# Patient Record
Sex: Female | Born: 2000 | Race: Black or African American | Hispanic: No | Marital: Single | Smoking: Never smoker
Health system: Southern US, Community
[De-identification: ages and names within clinical notes are randomized; demographics above are authoritative.]

## PROBLEM LIST (undated history)

## (undated) DIAGNOSIS — B009 Herpesviral infection, unspecified: Secondary | ICD-10-CM

---
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
## 2022-07-08 ENCOUNTER — Encounter (HOSPITAL_COMMUNITY): Payer: Self-pay | Admitting: *Deleted

## 2022-07-08 ENCOUNTER — Emergency Department (HOSPITAL_COMMUNITY)
Admission: EM | Admit: 2022-07-08 | Discharge: 2022-07-08 | Disposition: A | Discharge status: Home / Self Care | Payer: Medicaid Other | Attending: Emergency Medicine | Admitting: Emergency Medicine

## 2022-07-08 ENCOUNTER — Other Ambulatory Visit: Payer: Self-pay

## 2022-07-08 DIAGNOSIS — R300 Dysuria: Secondary | ICD-10-CM | POA: Diagnosis present

## 2022-07-08 LAB — PREGNANCY, URINE: Preg Test, Ur: NEGATIVE

## 2022-07-08 LAB — URINALYSIS, ROUTINE W REFLEX MICROSCOPIC
Bilirubin Urine: NEGATIVE
Glucose, UA: NEGATIVE mg/dL
Hgb urine dipstick: NEGATIVE
Ketones, ur: NEGATIVE mg/dL
Leukocytes,Ua: NEGATIVE
Nitrite: NEGATIVE
Protein, ur: NEGATIVE mg/dL
Specific Gravity, Urine: 1.018 (ref 1.005–1.030)
pH: 7 (ref 5.0–8.0)

## 2022-07-08 NOTE — ED Provider Notes (Signed)
[place] EMERGENCY DEPARTMENT AT [facility] Provider Note   CSN: [id number] Arrival date & time: 07/08/22  1013     History  Chief Complaint  Patient presents with   Dysuria    Nina Scott is a 22 y.o. female.  HPI 22 year old female presents today complaining of dysuria.  She states that she has been seen at her primary care office and diagnosed with bacterial vaginosis and chlamydia.  She was treated for both of these.  She continued to have urinary frequency and was treated for UTI.  She has completed those antibiotics.  She has continued to have some whitish discharge.  She has had ongoing pain with urination.  She has not had fever or chills.  She has some low back pain.     Home Medications Prior to Admission medications   Not on File      Allergies    Patient has no known allergies.    Review of Systems   Review of Systems  Physical Exam Updated Vital Signs BP (!) 143/100 (BP Location: Right Arm)   Pulse (!) 105   Temp 98.4 F (36.9 C) (Oral)   Resp 16   Ht 1.524 m (5')   Wt 83.5 kg   SpO2 100%   BMI 35.94 kg/m  Physical Exam Vitals and nursing note reviewed.  Constitutional:      General: She is not in acute distress.    Appearance: She is well-developed.  HENT:     Head: Normocephalic and atraumatic.     Right Ear: External ear normal.     Left Ear: External ear normal.     Nose: Nose normal.  Eyes:     Conjunctiva/sclera: Conjunctivae normal.     Pupils: Pupils are equal, round, and reactive to light.  Pulmonary:     Effort: Pulmonary effort is normal.  Genitourinary:    General: Normal vulva.     Comments: Mild white vaginal discharge No periurethral lesions noted No swelling or abscess noted Musculoskeletal:        General: Normal range of motion.     Cervical back: Normal range of motion and neck supple.  Skin:    General: Skin is warm and dry.  Neurological:     Mental Status: She is alert and oriented to person,  place, and time.     Motor: No abnormal muscle tone.     Coordination: Coordination normal.  Psychiatric:        Behavior: Behavior normal.        Thought Content: Thought content normal.     ED Results / Procedures / Treatments   Labs (all labs ordered are listed, but only abnormal results are displayed) Labs Reviewed  URINALYSIS, ROUTINE W REFLEX MICROSCOPIC  PREGNANCY, URINE    EKG None  Radiology No results found.  Procedures Procedures    Medications Ordered in ED Medications - No data to display  ED Course/ Medical Decision Making/ A&P                             Medical Decision Making Amount and/or Complexity of Data Reviewed Labs: ordered.   22 year old female presents today with dysuria.  She has recently been treated with multiple antibiotics.  Urinalysis here is normal. Differential diagnosis includes but is not limited to urethritis, STIs including herpetic lesions, pyelo-, Patient with no fever and normal urinalysis. We discussed that if there is no  evidence of UTI will not continue to treat. She will drink plenty of fluids and follow-up with her primary care doctor.        Final Clinical Impression(s) / ED Diagnoses Final diagnoses:  Dysuria    Rx / DC Orders ED Discharge Orders     None         Margarita Grizzle, MD 07/08/22 1324

## 2022-07-08 NOTE — ED Notes (Signed)
New urine sample sent to lab 

## 2022-07-08 NOTE — Discharge Instructions (Addendum)
Please drink plenty of fluids Make sure that your partner has also been treated for sexually transmitted infections Refrain from sexual intercourse for the next week Follow-up with your primary care doctor

## 2022-07-08 NOTE — ED Triage Notes (Signed)
Reports for a couple weeks of painful urinary symptoms. Has been checked and treated for 06/27/2022.

## 2023-01-22 ENCOUNTER — Ambulatory Visit: Payer: Medicaid Other | Admitting: Nurse Practitioner

## 2023-03-05 NOTE — Progress Notes (Unsigned)
   ANNUAL EXAM Patient name: Nina Scott MRN [id number]  Date of birth: 2000-05-02 Chief Complaint:   No chief complaint on file.  History of Present Illness:   Nina Scott is a 23 y.o. No obstetric history on file. {race:25618} female being seen today for a routine annual exam.  Current complaints: recurrent UTI with >2 infections in 6 moth period.  Dysuria urinary frequency urgency suprapubic pain Re-infection vs relapse: Symptoms recur within 2 weeks of treatment Related to timing of intercourse?  No LMP recorded.   The pregnancy intention screening data noted above was reviewed. Potential methods of contraception were discussed. The patient elected to proceed with No data recorded.   Last pap ***. Results were: {Pap findings:25134}. H/O abnormal pap: {yes/yes***/no:23866} Last mammogram: ***. Results were: {normal, abnormal, n/a:23837}. Family h/o breast cancer: {yes***/no:23838} Last colonoscopy: ***. Results were: {normal, abnormal, n/a:23837}. Family h/o colorectal cancer: {yes***/no:23838} STI testing:  Contraception:  Gardasil:      No data to display               No data to display           Review of Systems:   Pertinent items are noted in HPI Denies any headaches, blurred vision, fatigue, shortness of breath, chest pain, abdominal pain, abnormal vaginal discharge/itching/odor/irritation, problems with periods, bowel movements, urination, or intercourse unless otherwise stated above. Pertinent History Reviewed:  Reviewed past medical,surgical, social and family history.  Reviewed problem list, medications and allergies. Physical Assessment:  There were no vitals filed for this visit.There is no height or weight on file to calculate BMI.        Physical Examination:   General appearance - well appearing, and in no distress  Mental status - alert, oriented to person, place, and time  Psych:  She has a normal mood and affect  Skin - warm and dry,  normal color, no suspicious lesions noted  Chest - effort normal, all lung fields clear to auscultation bilaterally  Heart - normal rate and regular rhythm  Neck:  midline trachea, no thyromegaly or nodules  Breasts - breasts appear normal, no suspicious masses, no skin or nipple changes or  axillary nodes  Abdomen - soft, nontender, nondistended, no masses or organomegaly  Pelvic - VULVA: normal appearing vulva with no masses, tenderness or lesions  VAGINA: normal appearing vagina with normal color and discharge, no lesions  CERVIX: normal appearing cervix without discharge or lesions, no CMT  Thin prep pap is {Desc; done/not:10129} *** HR HPV cotesting  UTERUS: uterus is felt to be normal size, shape, consistency and nontender   ADNEXA: No adnexal masses or tenderness noted.  Rectal - normal rectal, good sphincter tone, no masses felt. Hemoccult: ***  Extremities:  No swelling or varicosities noted  Chaperone present for exam  No results found for this or any previous visit (from the past 24 hours).  Assessment & Plan:  Liberal fluid intake, avoid spermicides, postcoital voiding, 8 ounce cranberry juice once or twice daily, or cranberry concentrate tablets 500 mL to 1000 mg daily  Mammogram: {Mammo f/u:25212::"@ 21yo"}, or sooner if problems Colonoscopy: {TCS f/u:25213::"@ 21yo"}, or sooner if problems  No orders of the defined types were placed in this encounter.   Meds: No orders of the defined types were placed in this encounter.   Follow-up: No follow-ups on file.  Nina Scott, [place] 03/05/2023 4:34 PM

## 2023-03-06 ENCOUNTER — Encounter: Payer: Self-pay | Admitting: Physician Assistant

## 2023-03-06 ENCOUNTER — Ambulatory Visit: Payer: Medicaid Other | Admitting: Physician Assistant

## 2023-03-06 ENCOUNTER — Other Ambulatory Visit (HOSPITAL_COMMUNITY)
Admission: RE | Admit: 2023-03-06 | Discharge: 2023-03-06 | Disposition: A | Discharge status: Home / Self Care | Payer: Medicaid Other | Source: Ambulatory Visit | Attending: Physician Assistant | Admitting: Physician Assistant

## 2023-03-06 VITALS — BP 122/76 | HR 93 | Ht 60.0 in | Wt 198.5 lb

## 2023-03-06 DIAGNOSIS — Z113 Encounter for screening for infections with a predominantly sexual mode of transmission: Secondary | ICD-10-CM | POA: Insufficient documentation

## 2023-03-06 DIAGNOSIS — Z124 Encounter for screening for malignant neoplasm of cervix: Secondary | ICD-10-CM | POA: Diagnosis not present

## 2023-03-06 DIAGNOSIS — R300 Dysuria: Secondary | ICD-10-CM | POA: Diagnosis not present

## 2023-03-06 DIAGNOSIS — Z01419 Encounter for gynecological examination (general) (routine) without abnormal findings: Secondary | ICD-10-CM | POA: Diagnosis not present

## 2023-03-06 DIAGNOSIS — N390 Urinary tract infection, site not specified: Secondary | ICD-10-CM | POA: Insufficient documentation

## 2023-03-06 LAB — POCT URINALYSIS DIPSTICK
Bilirubin, UA: NEGATIVE
Glucose, UA: NEGATIVE
Ketones, UA: 15
Leukocytes, UA: NEGATIVE
Nitrite, UA: NEGATIVE
Protein, UA: POSITIVE — AB
Spec Grav, UA: 1.01 (ref 1.010–1.025)
Urobilinogen, UA: 0.2 U/dL
pH, UA: 6.5 (ref 5.0–8.0)

## 2023-03-06 MED ORDER — PHENAZOPYRIDINE HCL 200 MG PO TABS: 200.0000 mg | ORAL_TABLET | Freq: Three times a day (TID) | ORAL | 0 refills | Status: AC

## 2023-03-06 NOTE — Patient Instructions (Addendum)
Please pick up your medicine from your chosen pharmacy. Take one tablet 3 times a day with meals. Do this for two days.   Website for contraception options: [employer]edsider.org  Please avoid these common bladder irritants: Alcoholic Beverages Apples and Apple Juice Cantaloupe Peaches Pineapple Plums Strawberries Cranberries and Cranberry Juice Citrus Fruit Tomatoes and tomato based products Carbonated beverages Caffeine beverages Coffee Tea Milk products: Vinegar Spicy foods Chocolates Artificial Sweeteners

## 2023-03-07 LAB — RPR: RPR Ser Ql: NONREACTIVE

## 2023-03-07 LAB — HIV ANTIBODY (ROUTINE TESTING W REFLEX): HIV Screen 4th Generation wRfx: NONREACTIVE

## 2023-03-08 ENCOUNTER — Encounter: Payer: Self-pay | Admitting: Physician Assistant

## 2023-03-08 LAB — URINE CULTURE

## 2023-03-09 ENCOUNTER — Encounter: Payer: Self-pay | Admitting: Physician Assistant

## 2023-03-09 ENCOUNTER — Other Ambulatory Visit: Payer: Self-pay | Admitting: General Practice

## 2023-03-09 LAB — CERVICOVAGINAL ANCILLARY ONLY
Bacterial Vaginitis (gardnerella): NEGATIVE
Candida Glabrata: NEGATIVE
Candida Vaginitis: NEGATIVE
Chlamydia: NEGATIVE
Comment: NEGATIVE
Comment: NEGATIVE
Comment: NEGATIVE
Comment: NEGATIVE
Comment: NEGATIVE
Comment: NORMAL
Neisseria Gonorrhea: NEGATIVE
Trichomonas: NEGATIVE

## 2023-03-09 LAB — CYTOLOGY - PAP: Diagnosis: NEGATIVE

## 2023-03-09 MED ORDER — PHENAZOPYRIDINE HCL 200 MG PO TABS: 200.0000 mg | ORAL_TABLET | Freq: Three times a day (TID) | ORAL | 0 refills | Status: DC | PRN

## 2023-03-09 NOTE — Progress Notes (Signed)
 Drug change request received for pharmacy. Rx for Pyridium Tabs sent to pharmacy per request.

## 2023-03-12 ENCOUNTER — Ambulatory Visit (HOSPITAL_BASED_OUTPATIENT_CLINIC_OR_DEPARTMENT_OTHER): Payer: Medicaid Other

## 2023-03-15 ENCOUNTER — Ambulatory Visit (HOSPITAL_COMMUNITY)
Admission: EM | Admit: 2023-03-15 | Discharge: 2023-03-15 | Disposition: A | Discharge status: Home / Self Care | Attending: Emergency Medicine | Admitting: Emergency Medicine

## 2023-03-15 ENCOUNTER — Encounter (HOSPITAL_COMMUNITY): Payer: Self-pay

## 2023-03-15 DIAGNOSIS — J029 Acute pharyngitis, unspecified: Secondary | ICD-10-CM | POA: Diagnosis not present

## 2023-03-15 DIAGNOSIS — J988 Other specified respiratory disorders: Secondary | ICD-10-CM

## 2023-03-15 DIAGNOSIS — B9789 Other viral agents as the cause of diseases classified elsewhere: Secondary | ICD-10-CM

## 2023-03-15 LAB — POC COVID19/FLU A&B COMBO
Covid Antigen, POC: NEGATIVE
Influenza A Antigen, POC: NEGATIVE
Influenza B Antigen, POC: NEGATIVE

## 2023-03-15 LAB — POCT RAPID STREP A (OFFICE): Rapid Strep A Screen: NEGATIVE

## 2023-03-15 MED ORDER — LIDOCAINE VISCOUS HCL 2 % MT SOLN: 15.0000 mL | OROMUCOSAL | 0 refills | Status: AC | PRN

## 2023-03-15 NOTE — ED Triage Notes (Signed)
 Pt states that she has a sore throat, cough, chest congestion and headache. X2 days

## 2023-03-15 NOTE — ED Provider Notes (Signed)
 [facility]    CSN: [id number] Arrival date & time: 03/15/23  1254      History   Chief Complaint Chief Complaint  Patient presents with   Sore Throat    Sore throat, cough, chest congestion and headache    HPI Nina Scott is a 23 y.o. female.   Patient presents with sore throat, cough, chest congestion, headache x 2 days. Denies known fever, shortness of breath, chest pain, abdominal pain, nausea, vomiting, and diarrhea.    Sore Throat    History reviewed. No pertinent past medical history.  There are no active problems to display for this patient.   History reviewed. No pertinent surgical history.  OB History     Gravida  0   Para  0   Term  0   Preterm  0   AB  0   Living  0      SAB  0   IAB  0   Ectopic  0   Multiple  0   Live Births  0            Home Medications    Prior to Admission medications   Medication Sig Start Date End Date Taking? Authorizing Provider  lidocaine (XYLOCAINE) 2 % solution Use as directed 15 mLs in the mouth or throat as needed for mouth pain. 03/15/23  Yes Letta Kocher, NP    Family History Family History  Problem Relation Age of Onset   Hypertension Maternal Grandmother    Hypertension Mother     Social History Social History   Tobacco Use   Smoking status: Never   Smokeless tobacco: Never  Vaping Use   Vaping status: Never Used  Substance Use Topics   Alcohol use: Yes    Comment: occ   Drug use: Not Currently    Types: Marijuana     Allergies   Patient has no known allergies.   Review of Systems Review of Systems  Per HPI  Physical Exam Triage Vital Signs ED Triage Vitals  Encounter Vitals Group     BP 03/15/23 1334 119/73     Systolic BP Percentile --      Diastolic BP Percentile --      Pulse Rate 03/15/23 1334 (!) 113     Resp 03/15/23 1334 19     Temp 03/15/23 1334 98 F (36.7 C)     Temp Source 03/15/23 1334 Oral     SpO2 03/15/23 1334 95 %      Weight 03/15/23 1332 198 lb (89.8 kg)     Height 03/15/23 1332 5' (1.524 m)     Head Circumference --      Peak Flow --      Pain Score 03/15/23 1332 8     Pain Loc --      Pain Education --      Exclude from Growth Chart --    No data found.  Updated Vital Signs BP 119/73 (BP Location: Left Arm)   Pulse (!) 113   Temp 98 F (36.7 C) (Oral)   Resp 19   Ht 5' (1.524 m)   Wt 198 lb (89.8 kg)   LMP 02/11/2023   SpO2 95%   BMI 38.67 kg/m   Visual Acuity Right Eye Distance:   Left Eye Distance:   Bilateral Distance:    Right Eye Near:   Left Eye Near:    Bilateral Near:     Physical Exam Vitals  and nursing note reviewed.  Constitutional:      General: She is awake. She is not in acute distress.    Appearance: Normal appearance. She is well-developed and well-groomed. She is not ill-appearing.  HENT:     Right Ear: Tympanic membrane, ear canal and external ear normal.     Left Ear: Tympanic membrane, ear canal and external ear normal.     Nose: Congestion and rhinorrhea present.     Mouth/Throat:     Mouth: Mucous membranes are moist.     Pharynx: Posterior oropharyngeal erythema present. No oropharyngeal exudate.  Cardiovascular:     Rate and Rhythm: Normal rate and regular rhythm.  Pulmonary:     Effort: Pulmonary effort is normal.     Breath sounds: Normal breath sounds.  Musculoskeletal:        General: Normal range of motion.  Skin:    General: Skin is warm and dry.  Neurological:     Mental Status: She is alert.  Psychiatric:        Behavior: Behavior is cooperative.      UC Treatments / Results  Labs (all labs ordered are listed, but only abnormal results are displayed) Labs Reviewed  POC COVID19/FLU A&B COMBO  POCT RAPID STREP A (OFFICE)    EKG   Radiology No results found.  Procedures Procedures (including critical care time)  Medications Ordered in UC Medications - No data to display  Initial Impression / Assessment and Plan / UC  Course  I have reviewed the triage vital signs and the nursing notes.  Pertinent labs & imaging results that were available during my care of the patient were reviewed by me and considered in my medical decision making (see chart for details).     Upon assessment congestion and rhinorrhea are present, mild erythema noted to pharynx. Lungs clear bilaterally on auscultation.  COVID, flu and strep testing negative. Prescribed lidocaine as needed for sore throat. Discussed OTC medication as needed for symptoms. Discussed return precautions.  Final Clinical Impressions(s) / UC Diagnoses   Final diagnoses:  Viral respiratory illness  Sore throat     Discharge Instructions      Flu, COVID, and strep testing were negative today. I believe your symptoms are from a viral illness. I have prescribed lidocaine that you can gargle spit to help with sore throat, do not swallow.   Otherwise you can alternate between Tylenol and Ibuprofen as needed for pain and fever. I recommend Mucinex for cough and congestion as needed. Stay hydrated and get plenty of rest. Return here if symptoms persist or worsen.      ED Prescriptions     Medication Sig Dispense Auth. Provider   lidocaine (XYLOCAINE) 2 % solution Use as directed 15 mLs in the mouth or throat as needed for mouth pain. 100 mL Wynonia Lawman A, NP      PDMP not reviewed this encounter.   Wynonia Lawman A, NP 03/15/23 [phone]

## 2023-03-15 NOTE — Discharge Instructions (Addendum)
 Flu, COVID, and strep testing were negative today. I believe your symptoms are from a viral illness. I have prescribed lidocaine that you can gargle spit to help with sore throat, do not swallow.   Otherwise you can alternate between Tylenol and Ibuprofen as needed for pain and fever. I recommend Mucinex for cough and congestion as needed. Stay hydrated and get plenty of rest. Return here if symptoms persist or worsen.

## 2023-04-09 ENCOUNTER — Ambulatory Visit

## 2023-04-10 ENCOUNTER — Ambulatory Visit (HOSPITAL_COMMUNITY)

## 2023-04-20 ENCOUNTER — Encounter: Admitting: Podiatry

## 2023-04-20 NOTE — Progress Notes (Signed)
Patient did not show for scheduled appointment today.

## 2023-06-04 ENCOUNTER — Ambulatory Visit

## 2023-07-31 ENCOUNTER — Ambulatory Visit (HOSPITAL_COMMUNITY)
Admission: RE | Admit: 2023-07-31 | Discharge: 2023-07-31 | Disposition: A | Discharge status: Home / Self Care | Source: Ambulatory Visit | Attending: Emergency Medicine | Admitting: Emergency Medicine

## 2023-07-31 ENCOUNTER — Encounter (HOSPITAL_COMMUNITY): Payer: Self-pay

## 2023-07-31 VITALS — BP 112/73 | HR 106 | Temp 98.5°F | Resp 18

## 2023-07-31 DIAGNOSIS — R600 Localized edema: Secondary | ICD-10-CM | POA: Diagnosis not present

## 2023-07-31 DIAGNOSIS — Z3202 Encounter for pregnancy test, result negative: Secondary | ICD-10-CM

## 2023-07-31 DIAGNOSIS — M7989 Other specified soft tissue disorders: Secondary | ICD-10-CM | POA: Diagnosis present

## 2023-07-31 HISTORY — DX: Herpesviral infection, unspecified: B00.9

## 2023-07-31 LAB — POCT URINE PREGNANCY: Preg Test, Ur: NEGATIVE

## 2023-07-31 LAB — BASIC METABOLIC PANEL WITH GFR
Anion gap: 10 (ref 5–15)
BUN: 9 mg/dL (ref 6–20)
CO2: 23 mmol/L (ref 22–32)
Calcium: 8.9 mg/dL (ref 8.9–10.3)
Chloride: 104 mmol/L (ref 98–111)
Creatinine, Ser: 0.77 mg/dL (ref 0.44–1.00)
GFR, Estimated: >60 mL/min (ref >60)
Glucose, Bld: 93 mg/dL (ref 70–99)
Potassium: 3.7 mmol/L (ref 3.5–5.1)
Sodium: 137 mmol/L (ref 135–145)

## 2023-07-31 NOTE — Discharge Instructions (Signed)
 For now continue to take your Flagyl and Valtrex as prescribed. We have drawn a labs today to check your kidney function to ensure that your kidneys are not affected by the Valtrex.  If results are concerning an adjustment in treatment is recommended someone will call to let you know. Otherwise follow-up with your primary care provider or return here as needed. If you develop significantly increased leg swelling, shortness of breath, chest pain, dizziness, or weakness please seek immediate medical treatment in the emergency department.

## 2023-07-31 NOTE — ED Provider Notes (Addendum)
 [facility]    CSN: [id number] Arrival date & time: 07/31/23  1842      History   Chief Complaint Chief Complaint  Patient presents with   Foot Pain    Swollen feet - Entered by patient   Possible Pregnancy    HPI Nina Scott is a 23 y.o. female.   Patient presents with bilateral foot swelling that began today.  Patient denies pain to her feet but endorses a pressure sensation to her feet.  Patient states that she does work long hours on her feet and is concerned because the bottom of her that is broken with her feet hanging lower than the rest of her body and wonders if this could be causing her feet to swell as well.  Patient states that she does started taking Flagyl and Valtrex 2 days ago for bacterial vaginosis and HSV and wonders if this could be related.  Patient does report that her last menstrual cycle was on 617 and is requesting a pregnancy test as well.  Denies chest pain, shortness of breath, dizziness, and weakness.  The history is provided by the patient and medical records.  Foot Pain  Possible Pregnancy    Past Medical History:  Diagnosis Date   HSV (herpes simplex virus) infection     There are no active problems to display for this patient.   History reviewed. No pertinent surgical history.  OB History     Gravida  0   Para  0   Term  0   Preterm  0   AB  0   Living  0      SAB  0   IAB  0   Ectopic  0   Multiple  0   Live Births  0            Home Medications    Prior to Admission medications   Medication Sig Start Date End Date Taking? Authorizing Provider  metroNIDAZOLE (FLAGYL) 500 MG tablet Take 500 mg by mouth 2 (two) times daily. 07/27/23  Yes [provider]  valACYclovir (VALTREX) 1000 MG tablet Take 1,000 mg by mouth 2 (two) times daily. 07/27/23  Yes [provider]  lidocaine  (XYLOCAINE ) 2 % solution Use as directed 15 mLs in the mouth or throat as needed for mouth pain.  03/15/23   Johnie Rumaldo LABOR, NP    Family History Family History  Problem Relation Age of Onset   Hypertension Maternal Grandmother    Hypertension Mother     Social History Social History   Tobacco Use   Smoking status: Never   Smokeless tobacco: Never  Vaping Use   Vaping status: Never Used  Substance Use Topics   Alcohol use: Yes    Comment: occ   Drug use: Not Currently    Types: Marijuana     Allergies   Patient has no known allergies.   Review of Systems Review of Systems  Per HPI  Physical Exam Triage Vital Signs ED Triage Vitals  Encounter Vitals Group     BP 07/31/23 1855 112/73     Girls Systolic BP Percentile --      Girls Diastolic BP Percentile --      Boys Systolic BP Percentile --      Boys Diastolic BP Percentile --      Pulse Rate 07/31/23 1855 (!) 106     Resp 07/31/23 1855 18     Temp 07/31/23 1855 98.5 F (  36.9 C)     Temp Source 07/31/23 1855 Oral     SpO2 07/31/23 1855 97 %     Weight --      Height --      Head Circumference --      Peak Flow --      Pain Score 07/31/23 1852 2     Pain Loc --      Pain Education --      Exclude from Growth Chart --    No data found.  Updated Vital Signs BP 112/73 (BP Location: Left Arm)   Pulse (!) 106   Temp 98.5 F (36.9 C) (Oral)   Resp 18   LMP 06/30/2023 (Exact Date)   SpO2 97%   Visual Acuity Right Eye Distance:   Left Eye Distance:   Bilateral Distance:    Right Eye Near:   Left Eye Near:    Bilateral Near:     Physical Exam Vitals and nursing note reviewed.  Constitutional:      General: She is not in acute distress.    Appearance: Normal appearance. She is not ill-appearing.  Cardiovascular:     Rate and Rhythm: Normal rate and regular rhythm.     Pulses: Normal pulses.          Dorsalis pedis pulses are 2+ on the right side and 2+ on the left side.       Posterior tibial pulses are 2+ on the right side and 2+ on the left side.     Heart sounds: Normal heart  sounds.  Pulmonary:     Effort: Pulmonary effort is normal.     Breath sounds: Normal breath sounds.  Musculoskeletal:     Right lower leg: 1+ Edema present.     Left lower leg: 1+ Edema present.  Skin:    General: Skin is warm and dry.  Neurological:     Mental Status: She is alert.      UC Treatments / Results  Labs (all labs ordered are listed, but only abnormal results are displayed) Labs Reviewed  BASIC METABOLIC PANEL WITH GFR  POCT URINE PREGNANCY    EKG   Radiology No results found.  Procedures Procedures (including critical care time)  Medications Ordered in UC Medications - No data to display  Initial Impression / Assessment and Plan / UC Course  I have reviewed the triage vital signs and the nursing notes.  Pertinent labs & imaging results that were available during my care of the patient were reviewed by me and considered in my medical decision making (see chart for details).     Patient is overall well-appearing.  Vitals are stable.  Mild nonpitting edema present to bilateral feet.  Pulses present.  Urine pregnancy negative.  Ordered BMP to evaluate kidney function.  Recommended continuing taking Flagyl and Valtrex as prescribed for now.  Discussed follow-up, return, and strict ER precautions. Final Clinical Impressions(s) / UC Diagnoses   Final diagnoses:  Foot swelling     Discharge Instructions      For now continue to take your Flagyl and Valtrex as prescribed. We have drawn a labs today to check your kidney function to ensure that your kidneys are not affected by the Valtrex.  If results are concerning an adjustment in treatment is recommended someone will call to let you know. Otherwise follow-up with your primary care provider or return here as needed. If you develop significantly increased leg swelling, shortness of breath, chest pain,  dizziness, or weakness please seek immediate medical treatment in the emergency department.    ED  Prescriptions   None    PDMP not reviewed this encounter.   Johnie Rumaldo LABOR, NP 07/31/23 2016    Johnie Rumaldo A, NP 07/31/23 2016

## 2023-07-31 NOTE — ED Triage Notes (Signed)
 Pt states that her has bilateral ankle swelling that started today . She has a lot of pressure. She states she does stand all day and the foot of her bed is broken so her legs hang lower than her head all night.   She started flagyl and valtrex Wednesday not sure if its related, she held them today   When talking she realized her LMP was 06/30/2023 she asked for a pregnancy test.

## 2023-08-03 ENCOUNTER — Ambulatory Visit (HOSPITAL_COMMUNITY): Payer: Self-pay

## 2023-08-26 ENCOUNTER — Ambulatory Visit
Admission: RE | Admit: 2023-08-26 | Discharge: 2023-08-26 | Disposition: A | Discharge status: Home / Self Care | Source: Ambulatory Visit | Attending: Family Medicine

## 2023-08-26 VITALS — BP 96/68 | HR 94 | Temp 98.2°F | Resp 18

## 2023-08-26 DIAGNOSIS — Z113 Encounter for screening for infections with a predominantly sexual mode of transmission: Secondary | ICD-10-CM | POA: Diagnosis present

## 2023-08-26 DIAGNOSIS — N61 Mastitis without abscess: Secondary | ICD-10-CM | POA: Diagnosis present

## 2023-08-26 MED ORDER — CEPHALEXIN 500 MG PO CAPS: 500.0000 mg | ORAL_CAPSULE | Freq: Three times a day (TID) | ORAL | 0 refills | Status: AC

## 2023-08-26 NOTE — ED Provider Notes (Addendum)
 [facility]    CSN: [id number] Arrival date & time: 08/26/23  0843      History   Chief Complaint Chief Complaint  Patient presents with   SEXUALLY TRANSMITTED DISEASE    Need to get tested full panel, need to check my hsv 1, boil under breast, break out on stomach. - Entered by patient    HPI Nina Scott is a 23 y.o. female with a past medical history of HSV 1 presents for STD testing and breast infection.  Patient has a new sexual partner and would like STD screening.  She denies any symptoms including vaginal discharge, rashes, dysuria, fevers.  No known exposure.  In addition she reports a week of a red painful area under her left breast.  She is not sure if she was bitten by something.  Does report redness and warmth but no drainage, fevers or chills.  Denies history of MRSA.  Denies pregnancy or breast-feeding.  No OTC treatments have been used for this since onset.  No other concerns at this time  HPI  Past Medical History:  Diagnosis Date   HSV (herpes simplex virus) infection     There are no active problems to display for this patient.   History reviewed. No pertinent surgical history.  OB History     Gravida  0   Para  0   Term  0   Preterm  0   AB  0   Living  0      SAB  0   IAB  0   Ectopic  0   Multiple  0   Live Births  0            Home Medications    Prior to Admission medications   Medication Sig Start Date End Date Taking? Authorizing Provider  cephALEXin  (KEFLEX ) 500 MG capsule Take 1 capsule (500 mg total) by mouth 3 (three) times daily for 7 days. 08/26/23 09/02/23 Yes Tinslee Klare, Jodi R, NP  lidocaine  (XYLOCAINE ) 2 % solution Use as directed 15 mLs in the mouth or throat as needed for mouth pain. 03/15/23   Johnie Flaming A, NP  metroNIDAZOLE (FLAGYL) 500 MG tablet Take 500 mg by mouth 2 (two) times daily. 07/27/23   [provider]  valACYclovir (VALTREX) 1000 MG tablet Take 1,000 mg by mouth 2 (two) times  daily. 07/27/23   [provider]    Family History Family History  Problem Relation Age of Onset   Hypertension Maternal Grandmother    Hypertension Mother     Social History Social History   Tobacco Use   Smoking status: Never   Smokeless tobacco: Never  Vaping Use   Vaping status: Never Used  Substance Use Topics   Alcohol use: Yes    Comment: occ   Drug use: Not Currently    Types: Marijuana     Allergies   Patient has no known allergies.   Review of Systems Review of Systems  Genitourinary:        STD screening  Skin:        Breast infection     Physical Exam Triage Vital Signs ED Triage Vitals  Encounter Vitals Group     BP 08/26/23 0851 96/68     Girls Systolic BP Percentile --      Girls Diastolic BP Percentile --      Boys Systolic BP Percentile --      Boys Diastolic BP Percentile --  Pulse Rate 08/26/23 0851 94     Resp 08/26/23 0851 18     Temp 08/26/23 0851 98.2 F (36.8 C)     Temp Source 08/26/23 0851 Oral     SpO2 08/26/23 0851 97 %     Weight --      Height --      Head Circumference --      Peak Flow --      Pain Score 08/26/23 0849 0     Pain Loc --      Pain Education --      Exclude from Growth Chart --    No data found.  Updated Vital Signs BP 96/68 (BP Location: Right Arm)   Pulse 94   Temp 98.2 F (36.8 C) (Oral)   Resp 18   LMP 08/03/2023 (Exact Date)   SpO2 97%   Visual Acuity Right Eye Distance:   Left Eye Distance:   Bilateral Distance:    Right Eye Near:   Left Eye Near:    Bilateral Near:     Physical Exam Vitals and nursing note reviewed.  Constitutional:      General: She is not in acute distress.    Appearance: Normal appearance. She is not ill-appearing.  HENT:     Head: Normocephalic and atraumatic.  Eyes:     Pupils: Pupils are equal, round, and reactive to light.  Cardiovascular:     Rate and Rhythm: Normal rate.  Pulmonary:     Effort: Pulmonary effort is normal.  Chest:        Comments: There is a 2 cm x 1.5 cm area of erythema and warmth just distal at the 6 o'clock position of the left breast.  There is a central scabbed lesion without induration or fluctuance.  Area is tender to palpation. Skin:    General: Skin is warm and dry.  Neurological:     General: No focal deficit present.     Mental Status: She is alert and oriented to person, place, and time.  Psychiatric:        Mood and Affect: Mood normal.        Behavior: Behavior normal.      UC Treatments / Results  Labs (all labs ordered are listed, but only abnormal results are displayed) Labs Reviewed  RPR  HIV ANTIBODY (ROUTINE TESTING W REFLEX)  CERVICOVAGINAL ANCILLARY ONLY    EKG   Radiology No results found.  Procedures Procedures (including critical care time)  Medications Ordered in UC Medications - No data to display  Initial Impression / Assessment and Plan / UC Course  I have reviewed the triage vital signs and the nursing notes.  Pertinent labs & imaging results that were available during my care of the patient were reviewed by me and considered in my medical decision making (see chart for details).     Reviewed exam and symptoms with patient.  No red flags.  STD testing is ordered and will contact for any positive results.  Will start Keflex  3 times daily for 7 days for cellulitis of breast/infected bug bite.  Advised PCP follow-up if symptoms do not improve.  ER precautions reviewed Final Clinical Impressions(s) / UC Diagnoses   Final diagnoses:  Screening examination for STD (sexually transmitted disease)  Cellulitis of breast     Discharge Instructions      The clinic will contact you with results of the testing done today if positive.  Start Keflex  3 times a day  for 7 days.  Please follow-up with your PCP if your symptoms do not improve.  Please go to the ER for any worsening symptoms.  Hope you feel better soon!    ED Prescriptions     Medication  Sig Dispense Auth. Provider   cephALEXin  (KEFLEX ) 500 MG capsule Take 1 capsule (500 mg total) by mouth 3 (three) times daily for 7 days. 21 capsule Arian Mcquitty, Jodi R, NP      PDMP not reviewed this encounter.   Loreda Myla SAUNDERS, NP 08/26/23 9092    Loreda Myla SAUNDERS, NP 08/26/23 [phone]

## 2023-08-26 NOTE — Discharge Instructions (Addendum)
 The clinic will contact you with results of the testing done today if positive.  Start Keflex  3 times a day for 7 days.  Please follow-up with your PCP if your symptoms do not improve.  Please go to the ER for any worsening symptoms.  Hope you feel better soon!

## 2023-08-26 NOTE — ED Triage Notes (Signed)
 Pt present for STD testing. Pt states she has HSV 1. Pt denies new or recurrent symptoms.

## 2023-08-27 LAB — CERVICOVAGINAL ANCILLARY ONLY
Chlamydia: NEGATIVE
Comment: NEGATIVE
Comment: NEGATIVE
Comment: NORMAL
Neisseria Gonorrhea: NEGATIVE
Trichomonas: NEGATIVE

## 2023-08-27 LAB — RPR: RPR Ser Ql: NONREACTIVE

## 2023-08-27 LAB — HIV ANTIBODY (ROUTINE TESTING W REFLEX): HIV Screen 4th Generation wRfx: NONREACTIVE

## 2023-09-17 ENCOUNTER — Ambulatory Visit: Admitting: Physician Assistant

## 2023-10-26 ENCOUNTER — Ambulatory Visit: Admitting: Physician Assistant

## 2023-11-12 ENCOUNTER — Ambulatory Visit: Admitting: Physician Assistant

## 2023-12-15 ENCOUNTER — Other Ambulatory Visit: Payer: Self-pay | Admitting: Family Medicine

## 2023-12-15 DIAGNOSIS — N921 Excessive and frequent menstruation with irregular cycle: Secondary | ICD-10-CM

## 2023-12-15 DIAGNOSIS — R102 Pelvic and perineal pain unspecified side: Secondary | ICD-10-CM

## 2023-12-24 ENCOUNTER — Inpatient Hospital Stay: Admission: RE | Admit: 2023-12-24 | Discharge: 2023-12-24 | Attending: Family Medicine | Admitting: Family Medicine

## 2023-12-24 DIAGNOSIS — N921 Excessive and frequent menstruation with irregular cycle: Secondary | ICD-10-CM

## 2023-12-24 DIAGNOSIS — R102 Pelvic and perineal pain unspecified side: Secondary | ICD-10-CM

## 2024-01-19 ENCOUNTER — Encounter (HOSPITAL_COMMUNITY): Payer: Self-pay | Admitting: *Deleted

## 2024-01-19 ENCOUNTER — Ambulatory Visit (HOSPITAL_COMMUNITY)
Admission: EM | Admit: 2024-01-19 | Discharge: 2024-01-19 | Disposition: A | Discharge status: Home / Self Care | Attending: Emergency Medicine | Admitting: Emergency Medicine

## 2024-01-19 DIAGNOSIS — L249 Irritant contact dermatitis, unspecified cause: Secondary | ICD-10-CM

## 2024-01-19 MED ORDER — DEXAMETHASONE SOD PHOSPHATE PF 10 MG/ML IJ SOLN
10.0000 mg | Freq: Once | INTRAMUSCULAR | Status: AC
  Administered 2024-01-19: 10 mg via INTRAMUSCULAR

## 2024-01-19 MED ORDER — CETIRIZINE HCL 10 MG PO TABS: 10.0000 mg | ORAL_TABLET | Freq: Every day | ORAL | 0 refills | Status: AC

## 2024-01-19 NOTE — ED Provider Notes (Signed)
 " [facility]    CSN: [id number] Arrival date & time: 01/19/24  0805      History   Chief Complaint Chief Complaint  Patient presents with   Rash    HPI Nina Scott is a 24 y.o. female.   Patient presents to clinic over concern of an itchy rash to the arms, legs, face and neck.  Symptoms started 3 days ago.  The night before her boyfriend washed her clothes in a scented detergent that she has never used and she wore a tank top and shorts that were washed by him.  The next morning she woke up with a rash.  She also did use a roll on oil she has not used and ate some mozzarella sticks from [employer], is unsure what caused the rash.  Does have a history of eczema and has been using topical hydrocortisone to the rash without any improvement.   The history is provided by the patient and medical records.  Rash   Past Medical History:  Diagnosis Date   HSV (herpes simplex virus) infection     There are no active problems to display for this patient.   History reviewed. No pertinent surgical history.  OB History     Gravida  0   Para  0   Term  0   Preterm  0   AB  0   Living  0      SAB  0   IAB  0   Ectopic  0   Multiple  0   Live Births  0            Home Medications    Prior to Admission medications  Medication Sig Start Date End Date Taking? Authorizing Provider  cetirizine  (ZYRTEC ) 10 MG tablet Take 1 tablet (10 mg total) by mouth daily. 01/19/24  Yes Machai Desmith  N, FNP  lidocaine  (XYLOCAINE ) 2 % solution Use as directed 15 mLs in the mouth or throat as needed for mouth pain. 03/15/23   Johnie Flaming A, NP  metroNIDAZOLE (FLAGYL) 500 MG tablet Take 500 mg by mouth 2 (two) times daily. 07/27/23   [provider]  valACYclovir (VALTREX) 1000 MG tablet Take 1,000 mg by mouth 2 (two) times daily. 07/27/23   [provider]    Family History Family History  Problem Relation Age of Onset   Hypertension  Maternal Grandmother    Hypertension Mother     Social History Social History[1]   Allergies   Patient has no known allergies.   Review of Systems Review of Systems  Per HPI  Physical Exam Triage Vital Signs ED Triage Vitals  Encounter Vitals Group     BP 01/19/24 0825 (!) 102/58     Girls Systolic BP Percentile --      Girls Diastolic BP Percentile --      Boys Systolic BP Percentile --      Boys Diastolic BP Percentile --      Pulse Rate 01/19/24 0825 99     Resp 01/19/24 0825 16     Temp 01/19/24 0825 99.2 F (37.3 C)     Temp Source 01/19/24 0825 Oral     SpO2 01/19/24 0825 96 %     Weight --      Height --      Head Circumference --      Peak Flow --      Pain Score 01/19/24 0824 0     Pain  Loc --      Pain Education --      Exclude from Growth Chart --    No data found.  Updated Vital Signs BP (!) 102/58 (BP Location: Right Arm)   Pulse 99   Temp 99.2 F (37.3 C) (Oral)   Resp 16   LMP 12/26/2023 (Exact Date)   SpO2 96%   Visual Acuity Right Eye Distance:   Left Eye Distance:   Bilateral Distance:    Right Eye Near:   Left Eye Near:    Bilateral Near:     Physical Exam Vitals and nursing note reviewed.  Constitutional:      Appearance: Normal appearance.  HENT:     Head: Normocephalic and atraumatic.     Right Ear: External ear normal.     Left Ear: External ear normal.     Nose: Nose normal.     Mouth/Throat:     Mouth: Mucous membranes are moist.  Eyes:     Conjunctiva/sclera: Conjunctivae normal.  Cardiovascular:     Rate and Rhythm: Normal rate.  Pulmonary:     Effort: Pulmonary effort is normal. No respiratory distress.  Skin:    General: Skin is warm and dry.     Findings: Rash present. Rash is urticarial.  Neurological:     General: No focal deficit present.     Mental Status: She is alert.  Psychiatric:        Mood and Affect: Mood normal.      UC Treatments / Results  Labs (all labs ordered are listed, but only  abnormal results are displayed) Labs Reviewed - No data to display  EKG   Radiology No results found.  Procedures Procedures (including critical care time)  Medications Ordered in UC Medications  dexamethasone  (DECADRON ) injection 10 mg (10 mg Intramuscular Given 01/19/24 0911)    Initial Impression / Assessment and Plan / UC Course  I have reviewed the triage vital signs and the nursing notes.  Pertinent labs & imaging results that were available during my care of the patient were reviewed by me and considered in my medical decision making (see chart for details).  Vitals and triage reviewed, patient is hemodynamically stable.  Urticarial papular rash across bilateral forearms, lower legs, neck and face.  Due to the widespread nature of the rash that is not responsive to topical corticosteroids, will give IM Decadron  in clinic.  Oral antihistamine and lifestyle measures discussed.  Plan of care, follow-up care return precautions given, no questions at this time.    Final Clinical Impressions(s) / UC Diagnoses   Final diagnoses:  Irritant contact dermatitis, unspecified trigger     Discharge Instructions      We have given you a steroid shot that should help with the itching and the rash.  Start a daily antihistamine such as Zyrtec  to help with the itching as well, take this daily until the rash improves.  Continue with hydrocortisone topically as needed.  Wash all close and hypoallergenic detergent.  Avoid since and fragrances.  Consider hypoallergenic soap such as Dove sensitive skin.  Symptoms should improve with the steroid injection.  If no improvement or any changes seek follow-up care.      ED Prescriptions     Medication Sig Dispense Auth. Provider   cetirizine  (ZYRTEC ) 10 MG tablet Take 1 tablet (10 mg total) by mouth daily. 10 tablet Dreama, Keymiah Lyles  N, FNP      PDMP not reviewed this encounter.     [  1]  Social History Tobacco Use   Smoking  status: Never   Smokeless tobacco: Never  Vaping Use   Vaping status: Never Used  Substance Use Topics   Alcohol use: Yes    Comment: occ   Drug use: Not Currently    Types: Marijuana     Dreama Davied SAILOR, FNP 01/19/24 0913  "

## 2024-01-19 NOTE — Discharge Instructions (Signed)
 We have given you a steroid shot that should help with the itching and the rash.  Start a daily antihistamine such as Zyrtec  to help with the itching as well, take this daily until the rash improves.  Continue with hydrocortisone topically as needed.  Wash all close and hypoallergenic detergent.  Avoid since and fragrances.  Consider hypoallergenic soap such as Dove sensitive skin.  Symptoms should improve with the steroid injection.  If no improvement or any changes seek follow-up care.

## 2024-01-19 NOTE — ED Triage Notes (Signed)
 Pt states she has eczema but she has a rash all over her whole body X 4 days. She has been putting hydrocortisone on the areas. She states it feels like she has itching powder on her.

## 2024-02-16 ENCOUNTER — Encounter: Payer: Self-pay | Admitting: Advanced Practice Midwife

## 2024-03-22 ENCOUNTER — Encounter: Admitting: Family Medicine
